# Patient Record
Sex: Male | Born: 2000 | Race: Black or African American | Hispanic: No | Marital: Single | Smoking: Current every day smoker
Health system: Southern US, Community
[De-identification: ages and names within clinical notes are randomized; demographics above are authoritative.]

## PROBLEM LIST (undated history)

## (undated) DIAGNOSIS — F4310 Post-traumatic stress disorder, unspecified: Secondary | ICD-10-CM

## (undated) DIAGNOSIS — F909 Attention-deficit hyperactivity disorder, unspecified type: Secondary | ICD-10-CM

## (undated) DIAGNOSIS — F419 Anxiety disorder, unspecified: Secondary | ICD-10-CM

## (undated) HISTORY — PX: WRIST SURGERY: SHX841

## (undated) HISTORY — PX: HAND SURGERY: SHX662

---
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
## 2018-04-15 ENCOUNTER — Other Ambulatory Visit: Payer: Self-pay

## 2018-04-15 ENCOUNTER — Emergency Department (HOSPITAL_COMMUNITY)
Admission: EM | Admit: 2018-04-15 | Discharge: 2018-04-15 | Discharge status: Against Medical Advice | Payer: Medicaid Other

## 2018-04-22 ENCOUNTER — Other Ambulatory Visit: Payer: Self-pay

## 2018-04-22 ENCOUNTER — Ambulatory Visit (HOSPITAL_COMMUNITY)
Admission: EM | Admit: 2018-04-22 | Discharge: 2018-04-22 | Disposition: A | Discharge status: Home / Self Care | Payer: Medicaid Other | Attending: Family Medicine | Admitting: Family Medicine

## 2018-04-22 ENCOUNTER — Encounter (HOSPITAL_COMMUNITY): Payer: Self-pay | Admitting: Emergency Medicine

## 2018-04-22 DIAGNOSIS — R112 Nausea with vomiting, unspecified: Secondary | ICD-10-CM

## 2018-04-22 DIAGNOSIS — R197 Diarrhea, unspecified: Secondary | ICD-10-CM

## 2018-04-22 MED ORDER — FLUTICASONE PROPIONATE 50 MCG/ACT NA SUSP
2.0000 | Freq: Every day | NASAL | 0 refills | Status: DC
Start: 2018-04-22 — End: 2018-07-22

## 2018-04-22 MED ORDER — ONDANSETRON 4 MG PO TBDP: 4.0000 mg | ORAL_TABLET | Freq: Three times a day (TID) | ORAL | 0 refills | Status: DC | PRN

## 2018-04-22 MED ORDER — DICYCLOMINE HCL 20 MG PO TABS: 20.0000 mg | ORAL_TABLET | Freq: Two times a day (BID) | ORAL | 0 refills | Status: DC

## 2018-04-22 NOTE — ED Provider Notes (Signed)
[facility]    CSN: [id number] Arrival date & time: 04/22/18  1918     History   Chief Complaint Chief Complaint  Patient presents with  . Abdominal Pain    HPI Benjamin Hughes is a 17 y.o. male.   17 year old male comes in for 2-day history of nausea, vomiting, diarrhea.  Has had 2-3 episodes per day of nonbilious nonbloody emesis.  3-5 episodes of watery diarrhea per day.  Denies hematochezia, melena.  Has periumbilical pain that is intermittent, cramping in nature, better with water intake and bowel movement.  He has some nasal congestion, sinus pressure, ear fullness.  Denies cough, rhinorrhea.  Denies fever, chills, night sweats.  Has been able to tolerate fluids without difficulty.  Denies sick contact.  Denies recent travel, antibiotic use.     History reviewed. No pertinent past medical history.  There are no active problems to display for this patient.   History reviewed. No pertinent surgical history.     Home Medications    Prior to Admission medications   Medication Sig Start Date End Date Taking? Authorizing Provider  amphetamine-dextroamphetamine (ADDERALL XR) 30 MG 24 hr capsule Take 30 mg by mouth daily.    [provider]  dicyclomine (BENTYL) 20 MG tablet Take 1 tablet (20 mg total) by mouth 2 (two) times daily. 04/22/18   Cathie Hoops, Amy V, PA-C  fluticasone (FLONASE) 50 MCG/ACT nasal spray Place 2 sprays into both nostrils daily. 04/22/18   Cathie Hoops, Amy V, PA-C  ondansetron (ZOFRAN ODT) 4 MG disintegrating tablet Take 1 tablet (4 mg total) by mouth every 8 (eight) hours as needed for nausea or vomiting. 04/22/18   Belinda Fisher, PA-C    Family History Family History  Problem Relation Age of Onset  . Healthy Mother   . Healthy Father     Social History Social History   Tobacco Use  . Smoking status: Current Every Day Smoker    Packs/day: 0.50    Types: Cigarettes  . Smokeless tobacco: Never Used  Substance Use Topics  . Alcohol use: Not  on file  . Drug use: Yes    Types: Marijuana     Allergies   Patient has no known allergies.   Review of Systems Review of Systems  Reason unable to perform ROS: See HPI as above.     Physical Exam Triage Vital Signs ED Triage Vitals  Enc Vitals Group     BP 04/22/18 1952 138/65     Pulse Rate 04/22/18 1952 62     Resp 04/22/18 1952 18     Temp 04/22/18 1952 98.5 F (36.9 C)     Temp Source 04/22/18 1952 Oral     SpO2 04/22/18 1952 100 %     Weight --      Height --      Head Circumference --      Peak Flow --      Pain Score 04/22/18 1953 4     Pain Loc --      Pain Edu? --      Excl. in GC? --    No data found.  Updated Vital Signs BP 138/65 (BP Location: Left Arm)   Pulse 62   Temp 98.5 F (36.9 C) (Oral)   Resp 18   SpO2 100%   Physical Exam  Constitutional: He is oriented to person, place, and time. He appears well-developed and well-nourished.  Non-toxic appearance. He does not appear ill. No distress.  HENT:  Head: Normocephalic and atraumatic.  Right Ear: Tympanic membrane, external ear and ear canal normal. Tympanic membrane is not erythematous and not bulging.  Left Ear: External ear and ear canal normal. Tympanic membrane is erythematous. Tympanic membrane is not bulging.  Nose: Nose normal. Right sinus exhibits no maxillary sinus tenderness and no frontal sinus tenderness. Left sinus exhibits no maxillary sinus tenderness and no frontal sinus tenderness.  Mouth/Throat: Uvula is midline, oropharynx is clear and moist and mucous membranes are normal.  Eyes: Pupils are equal, round, and reactive to light. Conjunctivae are normal.  Neck: Normal range of motion. Neck supple.  Cardiovascular: Normal rate, regular rhythm and normal heart sounds. Exam reveals no gallop and no friction rub.  No murmur heard. Pulmonary/Chest: Effort normal and breath sounds normal. He has no decreased breath sounds. He has no wheezes. He has no rhonchi. He has no rales.    Abdominal: Soft. Bowel sounds are normal. He exhibits no mass. There is no tenderness. There is no rebound and no guarding.  Lymphadenopathy:    He has no cervical adenopathy.  Neurological: He is alert and oriented to person, place, and time.  Skin: Skin is warm and dry.  Psychiatric: He has a normal mood and affect. His behavior is normal. Judgment normal.     UC Treatments / Results  Labs (all labs ordered are listed, but only abnormal results are displayed) Labs Reviewed - No data to display  EKG None  Radiology No results found.  Procedures Procedures (including critical care time)  Medications Ordered in UC Medications - No data to display  Initial Impression / Assessment and Plan / UC Course  I have reviewed the triage vital signs and the nursing notes.  Pertinent labs & imaging results that were available during my care of the patient were reviewed by me and considered in my medical decision making (see chart for details).    No alarming signs on exam.  Will provide symptomatic treatment.  Push fluids.  Return precautions given.  Patient expresses understanding and agrees to plan.  Final Clinical Impressions(s) / UC Diagnoses   Final diagnoses:  Nausea vomiting and diarrhea    ED Prescriptions    Medication Sig Dispense Auth. Provider   fluticasone (FLONASE) 50 MCG/ACT nasal spray Place 2 sprays into both nostrils daily. 1 g Yu, Amy V, PA-C   ondansetron (ZOFRAN ODT) 4 MG disintegrating tablet Take 1 tablet (4 mg total) by mouth every 8 (eight) hours as needed for nausea or vomiting. 20 tablet Yu, Amy V, PA-C   dicyclomine (BENTYL) 20 MG tablet Take 1 tablet (20 mg total) by mouth 2 (two) times daily. 20 tablet Threasa Alpha, [place] 04/22/18 2011

## 2018-04-22 NOTE — ED Triage Notes (Signed)
Abdominal pain and diarrhea, occasionally vomiting.  Symptoms for 2 days

## 2018-04-22 NOTE — Discharge Instructions (Signed)
Flonase for nasal congestion/sinus pressure. Zofran for nausea and vomiting as needed. Bentyl for abdominal cramping. Keep hydrated, you urine should be clear to pale yellow in color. Bland diet, advance as tolerated. Monitor for any worsening of symptoms, nausea or vomiting not controlled by medication, worsening abdominal pain, fever, follow-up for reevaluation.

## 2018-05-07 ENCOUNTER — Ambulatory Visit (HOSPITAL_COMMUNITY)
Admission: EM | Admit: 2018-05-07 | Discharge: 2018-05-07 | Disposition: A | Discharge status: Home / Self Care | Payer: Medicaid Other | Attending: Family Medicine | Admitting: Family Medicine

## 2018-05-07 ENCOUNTER — Encounter (HOSPITAL_COMMUNITY): Payer: Self-pay

## 2018-05-07 DIAGNOSIS — F17210 Nicotine dependence, cigarettes, uncomplicated: Secondary | ICD-10-CM | POA: Insufficient documentation

## 2018-05-07 DIAGNOSIS — J029 Acute pharyngitis, unspecified: Secondary | ICD-10-CM | POA: Insufficient documentation

## 2018-05-07 LAB — CBC WITH DIFFERENTIAL/PLATELET
Abs Immature Granulocytes: 0.02 10*3/uL (ref 0.00–0.07)
Basophils Absolute: 0 10*3/uL (ref 0.0–0.1)
Basophils Relative: 1 %
Eosinophils Absolute: 0.1 10*3/uL (ref 0.0–0.5)
Eosinophils Relative: 1 %
HCT: 45.6 % (ref 39.0–52.0)
Hemoglobin: 15.1 g/dL (ref 13.0–17.0)
Immature Granulocytes: 0 %
Lymphocytes Relative: 31 %
Lymphs Abs: 2.4 10*3/uL (ref 0.7–4.0)
MCH: 30 pg (ref 26.0–34.0)
MCHC: 33.1 g/dL (ref 30.0–36.0)
MCV: 90.7 fL (ref 80.0–100.0)
Monocytes Absolute: 0.8 10*3/uL (ref 0.1–1.0)
Monocytes Relative: 10 %
Neutro Abs: 4.4 10*3/uL (ref 1.7–7.7)
Neutrophils Relative %: 57 %
Platelets: 288 10*3/uL (ref 150–400)
RBC: 5.03 MIL/uL (ref 4.22–5.81)
RDW: 13.7 % (ref 11.5–15.5)
WBC: 7.7 10*3/uL (ref 4.0–10.5)
nRBC: 0 % (ref 0.0–0.2)

## 2018-05-07 LAB — POCT RAPID STREP A: STREPTOCOCCUS, GROUP A SCREEN (DIRECT): NEGATIVE

## 2018-05-07 MED ORDER — CHLORHEXIDINE GLUCONATE 0.12 % MT SOLN: 15.0000 mL | Freq: Two times a day (BID) | OROMUCOSAL | 0 refills | Status: DC

## 2018-05-07 NOTE — Discharge Instructions (Signed)
Your strep test is negative.  Your sore throat may represent a viral illness and we are checking to make sure you do not have an underlying serious illness.  You may want to contact the people at [facility] for job placement.

## 2018-05-07 NOTE — ED Provider Notes (Signed)
[facility]    CSN: [id number] Arrival date & time: 05/07/18  1601     History   Chief Complaint Chief Complaint  Patient presents with  . Sore Throat    HPI Benjamin Hughes is a 17 y.o. male.   Pt c/o sore throat and congestion x 2 days.     History reviewed. No pertinent past medical history.  There are no active problems to display for this patient.   History reviewed. No pertinent surgical history.     Home Medications    Prior to Admission medications   Medication Sig Start Date End Date Taking? Authorizing Provider  amphetamine-dextroamphetamine (ADDERALL XR) 30 MG 24 hr capsule Take 30 mg by mouth daily.    [provider]  dicyclomine (BENTYL) 20 MG tablet Take 1 tablet (20 mg total) by mouth 2 (two) times daily. 04/22/18   Cathie Hoops, Amy V, PA-C  fluticasone (FLONASE) 50 MCG/ACT nasal spray Place 2 sprays into both nostrils daily. 04/22/18   Cathie Hoops, Amy V, PA-C  ondansetron (ZOFRAN ODT) 4 MG disintegrating tablet Take 1 tablet (4 mg total) by mouth every 8 (eight) hours as needed for nausea or vomiting. 04/22/18   Belinda Fisher, PA-C    Family History Family History  Problem Relation Age of Onset  . Healthy Mother   . Healthy Father     Social History Social History   Tobacco Use  . Smoking status: Current Every Day Smoker    Packs/day: 0.50    Types: Cigarettes  . Smokeless tobacco: Never Used  Substance Use Topics  . Alcohol use: Not on file  . Drug use: Yes    Types: Marijuana     Allergies   Patient has no known allergies.   Review of Systems Review of Systems   Physical Exam Triage Vital Signs ED Triage Vitals [05/07/18 1721]  Enc Vitals Group     BP 123/73     Pulse Rate 64     Resp 16     Temp 97.9 F (36.6 C)     Temp Source Oral     SpO2 100 %     Weight 124 lb (56.2 kg)     Height      Head Circumference      Peak Flow      Pain Score 6     Pain Loc      Pain Edu?      Excl. in GC?    No data  found.  Updated Vital Signs BP 123/73 (BP Location: Right Arm)   Pulse 64   Temp 97.9 F (36.6 C) (Oral)   Resp 16   Wt 56.2 kg   SpO2 100%    Physical Exam  Constitutional: He is oriented to person, place, and time. He appears well-developed and well-nourished.  HENT:  Head: Normocephalic.  Right Ear: Hearing, tympanic membrane and ear canal normal.  Left Ear: Hearing, tympanic membrane and ear canal normal.  Mouth/Throat: Uvula is midline. Posterior oropharyngeal erythema present.  Eyes: Pupils are equal, round, and reactive to light. EOM are normal.  Neck: Normal range of motion. Neck supple.  Cardiovascular: Normal rate, normal heart sounds and intact distal pulses.  Pulmonary/Chest: Effort normal.  Neurological: He is alert and oriented to person, place, and time.  Skin: Skin is warm and dry.  Psychiatric: He has a normal mood and affect. His behavior is normal.  Nursing note and vitals reviewed.    UC Treatments /  Results  Labs (all labs ordered are listed, but only abnormal results are displayed) Labs Reviewed  CULTURE, GROUP A STREP [facility])  POCT RAPID STREP A    EKG None  Radiology No results found.  Procedures Procedures (including critical care time)  Medications Ordered in UC Medications - No data to display  Initial Impression / Assessment and Plan / UC Course  I have reviewed the triage vital signs and the nursing notes.  Pertinent labs & imaging results that were available during my care of the patient were reviewed by me and considered in my medical decision making (see chart for details).    Final Clinical Impressions(s) / UC Diagnoses   Final diagnoses:  None   Discharge Instructions   None    ED Prescriptions    None     Controlled Substance Prescriptions [place] Controlled Substance Registry consulted? Not Applicable   Elvina Sidle, MD 05/07/18 1758

## 2018-05-07 NOTE — ED Triage Notes (Signed)
Pt c/o sore throat and congestion x2 days

## 2018-05-08 LAB — EPSTEIN-BARR VIRUS VCA, IGM: EBV VCA IgM: <36 U/mL (ref 0.0–35.9)

## 2018-05-08 LAB — HIV ANTIBODY (ROUTINE TESTING W REFLEX): HIV Screen 4th Generation wRfx: NONREACTIVE

## 2018-05-10 LAB — CULTURE, GROUP A STREP (THRC)

## 2018-07-22 ENCOUNTER — Encounter (HOSPITAL_COMMUNITY): Payer: Self-pay | Admitting: Emergency Medicine

## 2018-07-22 ENCOUNTER — Ambulatory Visit (INDEPENDENT_AMBULATORY_CARE_PROVIDER_SITE_OTHER): Payer: Medicaid Other

## 2018-07-22 ENCOUNTER — Other Ambulatory Visit: Payer: Self-pay

## 2018-07-22 ENCOUNTER — Ambulatory Visit (HOSPITAL_COMMUNITY)
Admission: EM | Admit: 2018-07-22 | Discharge: 2018-07-22 | Disposition: A | Discharge status: Home / Self Care | Payer: Medicaid Other | Attending: Emergency Medicine | Admitting: Emergency Medicine

## 2018-07-22 DIAGNOSIS — R0602 Shortness of breath: Secondary | ICD-10-CM | POA: Diagnosis not present

## 2018-07-22 DIAGNOSIS — S2231XA Fracture of one rib, right side, initial encounter for closed fracture: Secondary | ICD-10-CM

## 2018-07-22 DIAGNOSIS — S60211A Contusion of right wrist, initial encounter: Secondary | ICD-10-CM

## 2018-07-22 DIAGNOSIS — M25531 Pain in right wrist: Secondary | ICD-10-CM

## 2018-07-22 MED ORDER — IBUPROFEN 600 MG PO TABS: 600.0000 mg | ORAL_TABLET | Freq: Four times a day (QID) | ORAL | 0 refills | Status: DC | PRN

## 2018-07-22 MED ORDER — TIZANIDINE HCL 4 MG PO TABS: 4.0000 mg | ORAL_TABLET | Freq: Three times a day (TID) | ORAL | 0 refills | Status: DC | PRN

## 2018-07-22 MED ORDER — HYDROCODONE-ACETAMINOPHEN 5-325 MG PO TABS: 1.0000 | ORAL_TABLET | Freq: Four times a day (QID) | ORAL | 0 refills | Status: DC | PRN
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
## 2018-07-22 NOTE — Discharge Instructions (Addendum)
Your x-ray was suspicious for a nondisplaced ninth rib fracture.  Otherwise your x-ray was normal.  Use the incentive spirometer 2-4 times an hour to help prevent a pneumonia as we discussed.  Take 600 mg ibuprofen combined with a Tylenol containing product 3 or 4 times a day as needed for pain.  Take the ibuprofen with 1 g of Tylenol for mild to moderate pain, ibuprofen with 1-2 Norco for severe pain only.  Do not take the Tylenol and Norco as they both have Tylenol in them and too much Tylenol can hurt your liver.  This may take up to 4 to 6 weeks to completely heal.  Wear the wrist splint as needed for comfort.  This should be better in about 10 days.
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
## 2018-08-01 ENCOUNTER — Encounter (HOSPITAL_COMMUNITY): Payer: Self-pay

## 2018-08-01 ENCOUNTER — Other Ambulatory Visit: Payer: Self-pay

## 2018-08-01 ENCOUNTER — Ambulatory Visit (HOSPITAL_COMMUNITY)
Admission: EM | Admit: 2018-08-01 | Discharge: 2018-08-01 | Disposition: A | Discharge status: Home / Self Care | Payer: Medicaid Other | Attending: Internal Medicine | Admitting: Internal Medicine

## 2018-08-01 DIAGNOSIS — A6002 Herpesviral infection of other male genital organs: Secondary | ICD-10-CM | POA: Insufficient documentation

## 2018-08-01 DIAGNOSIS — Z711 Person with feared health complaint in whom no diagnosis is made: Secondary | ICD-10-CM | POA: Diagnosis present

## 2018-08-01 DIAGNOSIS — Z113 Encounter for screening for infections with a predominantly sexual mode of transmission: Secondary | ICD-10-CM

## 2018-08-01 DIAGNOSIS — Z202 Contact with and (suspected) exposure to infections with a predominantly sexual mode of transmission: Secondary | ICD-10-CM

## 2018-08-01 DIAGNOSIS — Z760 Encounter for issue of repeat prescription: Secondary | ICD-10-CM | POA: Diagnosis not present

## 2018-08-01 DIAGNOSIS — Z8619 Personal history of other infectious and parasitic diseases: Secondary | ICD-10-CM

## 2018-08-01 LAB — POCT URINALYSIS DIP (DEVICE)
BILIRUBIN URINE: NEGATIVE
GLUCOSE, UA: NEGATIVE mg/dL
KETONES UR: NEGATIVE mg/dL
Leukocytes, UA: NEGATIVE
Nitrite: NEGATIVE
PH: 6.5 (ref 5.0–8.0)
Protein, ur: NEGATIVE mg/dL
SPECIFIC GRAVITY, URINE: 1.025 (ref 1.005–1.030)
Urobilinogen, UA: 0.2 mg/dL (ref 0.0–1.0)

## 2018-08-01 MED ORDER — METRONIDAZOLE 500 MG PO TABS
ORAL_TABLET | ORAL | Status: AC
  Filled 2018-08-01: qty 4

## 2018-08-01 MED ORDER — METRONIDAZOLE 500 MG PO TABS
2000.0000 mg | ORAL_TABLET | Freq: Once | ORAL | Status: AC
  Administered 2018-08-01: 2000 mg via ORAL

## 2018-08-01 MED ORDER — VALACYCLOVIR HCL 1 G PO TABS: 1000.0000 mg | ORAL_TABLET | Freq: Every day | ORAL | 0 refills | Status: AC

## 2018-08-01 NOTE — Discharge Instructions (Signed)
Declines treatment for gonorrhea and chlamydia, would like to wait on results Metronidazole given in office for potential trich Urine cytology obtained  HIV/ syphilis testing today Valtrex refilled for current herpes outbreak.  Take as directed and to completion We will follow up with you regarding the results of your test If tests are positive, please abstain from sexual activity for at least 7 days and notify partners Follow up with PCP or community health if symptoms persists Return here or go to ER if you have any new or worsening symptoms such as fever, chills, nausea, vomiting, abdominal or pelvic pain, urethral discharge, dysuria, penile rashes or lesions, testicular swelling or pain.

## 2018-08-01 NOTE — ED Provider Notes (Addendum)
[facility]   [id number] 08/01/18 Arrival Time: 1558   WU:[id number]:CONCERN FOR STD  SUBJECTIVE:  Benjamin Hughes is a 18 y.o. male who presents requesting STI screening.  Currently asymptomatic.  Partner tested positive for trich.  Last unprotected sexual encounter 2 weeks ago.  Sexually active with 3 male partners in the last 6 months.  Reports hx of STD in the past and diagnosed with herpes. Requests prescription refill for valtrex for new outbreak.  Denies fever, chills, nausea, vomiting, abdominal or pelvic pain, urethral discharge, dysuria, penile rashes or lesions, testicular swelling or pain.       No LMP for male patient.  ROS: As per HPI.  History reviewed. No pertinent past medical history. History reviewed. No pertinent surgical history. No Known Allergies No current facility-administered medications on file prior to encounter.    Current Outpatient Medications on File Prior to Encounter  Medication Sig Dispense Refill  . tiZANidine (ZANAFLEX) 4 MG tablet Take 1 tablet (4 mg total) by mouth every 8 (eight) hours as needed for muscle spasms. 30 tablet 0  . HYDROcodone-acetaminophen (NORCO/VICODIN) 5-325 MG tablet Take 1-2 tablets by mouth every 6 (six) hours as needed for moderate pain or severe pain. 12 tablet 0  . ibuprofen (ADVIL,MOTRIN) 600 MG tablet Take 1 tablet (600 mg total) by mouth every 6 (six) hours as needed. 30 tablet 0   Social History   Socioeconomic History  . Marital status: Single    Spouse name: Not on file  . Number of children: Not on file  . Years of education: Not on file  . Highest education level: Not on file  Occupational History  . Not on file  Social Needs  . Financial resource strain: Not on file  . Food insecurity:    Worry: Not on file    Inability: Not on file  . Transportation needs:    Medical: Not on file    Non-medical: Not on file  Tobacco Use  . Smoking status: Current Every Day Smoker    Packs/day: 0.25    Types:  Cigarettes  . Smokeless tobacco: Never Used  Substance and Sexual Activity  . Alcohol use: Not on file  . Drug use: Yes    Types: Marijuana  . Sexual activity: Not on file  Lifestyle  . Physical activity:    Days per week: Not on file    Minutes per session: Not on file  . Stress: Not on file  Relationships  . Social connections:    Talks on phone: Not on file    Gets together: Not on file    Attends religious service: Not on file    Active member of club or organization: Not on file    Attends meetings of clubs or organizations: Not on file    Relationship status: Not on file  . Intimate partner violence:    Fear of current or ex partner: Not on file    Emotionally abused: Not on file    Physically abused: Not on file    Forced sexual activity: Not on file  Other Topics Concern  . Not on file  Social History Narrative  . Not on file   Family History  Problem Relation Age of Onset  . Healthy Mother   . Healthy Father     OBJECTIVE:  Vitals:   08/01/18 1736 08/01/18 1739  BP: 123/64   Pulse: 65   Resp: 16   Temp: 99.2 F (37.3 C)  TempSrc: Oral   SpO2: 100% 100%     General appearance: alert, NAD, appears stated age Head: NCAT Throat: lips, mucosa, and tongue normal; teeth and gums normal Lungs: CTA bilaterally without adventitious breath sounds Heart: regular rate and rhythm.  Radial pulses 2+ symmetrical bilaterally Back: no CVA tenderness Abdomen: soft, non-tender; bowel sounds normal; no guarding GU: deferred; urine cytology Skin: warm and dry Psychological:  Alert and cooperative. Normal mood and affect.  LABS:  Results for orders placed or performed during the hospital encounter of 08/01/18  POCT urinalysis dip (device)  Result Value Ref Range   Glucose, UA NEGATIVE NEGATIVE mg/dL   Bilirubin Urine NEGATIVE NEGATIVE   Ketones, ur NEGATIVE NEGATIVE mg/dL   Specific Gravity, Urine 1.025 1.005 - 1.030   Hgb urine dipstick TRACE (A) NEGATIVE    pH 6.5 5.0 - 8.0   Protein, ur NEGATIVE NEGATIVE mg/dL   Urobilinogen, UA 0.2 0.0 - 1.0 mg/dL   Nitrite NEGATIVE NEGATIVE   Leukocytes, UA NEGATIVE NEGATIVE    Labs Reviewed  POCT URINALYSIS DIP (DEVICE) - Abnormal; Notable for the following components:      Result Value   Hgb urine dipstick TRACE (*)    All other components within normal limits  RPR  HIV ANTIBODY (ROUTINE TESTING W REFLEX)  URINE CYTOLOGY ANCILLARY ONLY    ASSESSMENT & PLAN:  1. Concern about STD in male without diagnosis   2. STD exposure   3. Herpes genitalis in men     Meds ordered this encounter  Medications  . metroNIDAZOLE (FLAGYL) tablet 2,000 mg  . valACYclovir (VALTREX) 1000 MG tablet    Sig: Take 1 tablet (1,000 mg total) by mouth daily for 5 days.    Dispense:  5 tablet    Refill:  0    Order Specific Question:   Supervising Provider    Answer:   Eustace MooreELSON, YVONNE SUE [[id number]]    Pending: Labs Reviewed  POCT URINALYSIS DIP (DEVICE) - Abnormal; Notable for the following components:      Result Value   Hgb urine dipstick TRACE (*)    All other components within normal limits  RPR  HIV ANTIBODY (ROUTINE TESTING W REFLEX)  URINE CYTOLOGY ANCILLARY ONLY    Declines treatment for gonorrhea and chlamydia, would like to wait on results Metronidazole given in office for potential trich Urine cytology obtained  HIV/ syphilis testing today Valtrex refilled for current herpes outbreak.  Take as directed and to completion We will follow up with you regarding the results of your test If tests are positive, please abstain from sexual activity for at least 7 days and notify partners Follow up with PCP or community health if symptoms persists Return here or go to ER if you have any new or worsening symptoms such as fever, chills, nausea, vomiting, abdominal or pelvic pain, urethral discharge, dysuria, penile rashes or lesions, testicular swelling or pain.      Reviewed expectations re: course of  current medical issues. Questions answered. Outlined signs and symptoms indicating need for more acute intervention. Patient verbalized understanding. After Visit Summary given.       Rennis HardingWurst, Shirley Bolle, PA-C 08/01/18 1838    Rennis HardingWurst, Andoni Busch, PA-C 08/01/18 1839

## 2018-08-01 NOTE — ED Triage Notes (Addendum)
[facility] due to exposure from partner, partner tested positive for Trichomoniasis yesterday 07/31/2018. Pt also complains of burning sensation in penis area.

## 2018-08-02 LAB — HIV ANTIBODY (ROUTINE TESTING W REFLEX): HIV Screen 4th Generation wRfx: NONREACTIVE

## 2018-08-02 LAB — RPR: RPR: NONREACTIVE

## 2018-08-04 ENCOUNTER — Telehealth (HOSPITAL_COMMUNITY): Payer: Self-pay | Admitting: Emergency Medicine

## 2018-08-04 LAB — URINE CYTOLOGY ANCILLARY ONLY
Chlamydia: NEGATIVE
Neisseria Gonorrhea: NEGATIVE
Trichomonas: POSITIVE — AB

## 2018-08-04 NOTE — Telephone Encounter (Signed)
Trichomonas is positive. Rx metronidazole was given at the urgent care visit. Pt needs education to please refrain from sexual intercourse for 7 days to give the medicine time to work. Sexual partners need to be notified and tested/treated. Condoms may reduce risk of reinfection. Recheck for further evaluation if symptoms are not improving.   Attempted to reach patient. No answer at this time. Voicemail left.    

## 2018-08-06 ENCOUNTER — Ambulatory Visit (HOSPITAL_COMMUNITY)
Admission: EM | Admit: 2018-08-06 | Discharge: 2018-08-06 | Disposition: A | Discharge status: Home / Self Care | Payer: Medicaid Other

## 2018-08-06 NOTE — ED Notes (Signed)
Informed patient of his test results from his visit on 08/01/2018 an dlet him know his medication was at the pharmacy.

## 2018-08-07 ENCOUNTER — Telehealth (HOSPITAL_COMMUNITY): Payer: Self-pay | Admitting: Emergency Medicine

## 2018-08-07 NOTE — Telephone Encounter (Signed)
Patient informed by staff of results yesterday.

## 2018-09-16 ENCOUNTER — Ambulatory Visit (INDEPENDENT_AMBULATORY_CARE_PROVIDER_SITE_OTHER): Payer: Medicaid Other

## 2018-09-16 ENCOUNTER — Ambulatory Visit (HOSPITAL_COMMUNITY)
Admission: EM | Admit: 2018-09-16 | Discharge: 2018-09-16 | Disposition: A | Discharge status: Home / Self Care | Payer: Medicaid Other | Attending: Family Medicine | Admitting: Family Medicine

## 2018-09-16 ENCOUNTER — Encounter (HOSPITAL_COMMUNITY): Payer: Self-pay | Admitting: Emergency Medicine

## 2018-09-16 DIAGNOSIS — S0993XA Unspecified injury of face, initial encounter: Secondary | ICD-10-CM | POA: Diagnosis not present

## 2018-09-16 DIAGNOSIS — S022XXA Fracture of nasal bones, initial encounter for closed fracture: Secondary | ICD-10-CM | POA: Diagnosis not present

## 2018-09-16 DIAGNOSIS — S63502A Unspecified sprain of left wrist, initial encounter: Secondary | ICD-10-CM

## 2018-09-16 MED ORDER — IBUPROFEN 800 MG PO TABS: 800.0000 mg | ORAL_TABLET | Freq: Three times a day (TID) | ORAL | 0 refills | Status: DC
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
## 2018-09-16 NOTE — Discharge Instructions (Signed)
Nasal fracture should heal on its own in 4 to 6 weeks If having persistent issues please follow-up with [facility]NT Use anti-inflammatories for pain/swelling. You may take up to 800 mg Ibuprofen every 8 hours with food. You may supplement Ibuprofen with Tylenol [phone] mg every 8 hours.   Ice  Follow-up if having persistent headaches, change in vision, worsening pain, difficulty moving jaw.

## 2018-09-19 ENCOUNTER — Ambulatory Visit (HOSPITAL_COMMUNITY)
Admission: EM | Admit: 2018-09-19 | Discharge: 2018-09-19 | Disposition: A | Discharge status: Home / Self Care | Payer: Medicaid Other | Attending: Family Medicine | Admitting: Family Medicine

## 2018-09-19 ENCOUNTER — Other Ambulatory Visit: Payer: Self-pay

## 2018-09-19 ENCOUNTER — Encounter (HOSPITAL_COMMUNITY): Payer: Self-pay | Admitting: Family Medicine

## 2018-09-19 DIAGNOSIS — J320 Chronic maxillary sinusitis: Secondary | ICD-10-CM | POA: Diagnosis not present

## 2018-09-19 DIAGNOSIS — R05 Cough: Secondary | ICD-10-CM | POA: Diagnosis not present

## 2018-09-19 DIAGNOSIS — S022XXD Fracture of nasal bones, subsequent encounter for fracture with routine healing: Secondary | ICD-10-CM

## 2018-09-19 MED ORDER — HYDROCODONE-ACETAMINOPHEN 5-325 MG PO TABS: 1.0000 | ORAL_TABLET | Freq: Four times a day (QID) | ORAL | 0 refills | Status: DC | PRN

## 2018-09-19 MED ORDER — AMOXICILLIN 875 MG PO TABS: 875.0000 mg | ORAL_TABLET | Freq: Two times a day (BID) | ORAL | 0 refills | Status: DC

## 2018-09-19 NOTE — Discharge Instructions (Addendum)
You will benefit from some oxymetazoline (Afrin) which is over-the-counter nasal spray.  Spray in 1 nostril or the other every night before going to bed so that you can breathe better.
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
## 2018-09-19 NOTE — ED Triage Notes (Signed)
Per pt he has been having body aches, cough, congestion for about 1 day now. With chills and weakness. No fevers. N/V BOTH TODAY

## 2018-10-06 ENCOUNTER — Other Ambulatory Visit: Payer: Self-pay

## 2018-10-06 ENCOUNTER — Ambulatory Visit (HOSPITAL_COMMUNITY)
Admission: EM | Admit: 2018-10-06 | Discharge: 2018-10-06 | Disposition: A | Discharge status: Home / Self Care | Payer: Medicaid Other | Attending: Family Medicine | Admitting: Family Medicine

## 2018-10-06 ENCOUNTER — Encounter (HOSPITAL_COMMUNITY): Payer: Self-pay | Admitting: Emergency Medicine

## 2018-10-06 DIAGNOSIS — R300 Dysuria: Secondary | ICD-10-CM | POA: Diagnosis not present

## 2018-10-06 DIAGNOSIS — B0089 Other herpesviral infection: Secondary | ICD-10-CM

## 2018-10-06 DIAGNOSIS — M25511 Pain in right shoulder: Secondary | ICD-10-CM | POA: Diagnosis not present

## 2018-10-06 DIAGNOSIS — Z760 Encounter for issue of repeat prescription: Secondary | ICD-10-CM

## 2018-10-06 DIAGNOSIS — Z113 Encounter for screening for infections with a predominantly sexual mode of transmission: Secondary | ICD-10-CM | POA: Diagnosis not present

## 2018-10-06 DIAGNOSIS — Z202 Contact with and (suspected) exposure to infections with a predominantly sexual mode of transmission: Secondary | ICD-10-CM | POA: Diagnosis present

## 2018-10-06 HISTORY — DX: Attention-deficit hyperactivity disorder, unspecified type: F90.9

## 2018-10-06 HISTORY — DX: Post-traumatic stress disorder, unspecified: F43.10

## 2018-10-06 HISTORY — DX: Anxiety disorder, unspecified: F41.9

## 2018-10-06 LAB — POCT URINALYSIS DIP (DEVICE)
Glucose, UA: 100 mg/dL — AB
Hgb urine dipstick: NEGATIVE
Ketones, ur: 15 mg/dL — AB
Leukocytes,Ua: NEGATIVE
Nitrite: NEGATIVE
Protein, ur: NEGATIVE mg/dL
Specific Gravity, Urine: >=1.03 (ref 1.005–1.030)
Urobilinogen, UA: 1 mg/dL (ref 0.0–1.0)
pH: 6 (ref 5.0–8.0)

## 2018-10-06 MED ORDER — VALACYCLOVIR HCL 1 G PO TABS: 1000.0000 mg | ORAL_TABLET | Freq: Every day | ORAL | 1 refills | Status: DC

## 2018-10-06 MED ORDER — CYCLOBENZAPRINE HCL 10 MG PO TABS: 10.0000 mg | ORAL_TABLET | Freq: Two times a day (BID) | ORAL | 0 refills | Status: DC | PRN

## 2018-10-06 MED ORDER — IBUPROFEN 800 MG PO TABS: 800.0000 mg | ORAL_TABLET | Freq: Three times a day (TID) | ORAL | 0 refills | Status: DC

## 2018-10-06 NOTE — ED Provider Notes (Signed)
[facility]    CSN: [id number] Arrival date & time: 10/06/18  1158     History   Chief Complaint Chief Complaint  Patient presents with  . Urinary Tract Infection  . Medication Refill    HPI Benjamin Hughes is a 18 y.o. male.   HPI  Patient is here for 2 problems.  First he has right shoulder pain.  He states he discussed with his primary care doctor, and he had an appointment to go see the doctor.  The appointment got canceled because of changes in the schedule because of coronavirus quarantine. He has been taking ibuprofen.  He states that this helps somewhat.  He is having trouble sleeping at night because of the shoulder pain. He also states he thinks he has "a urinary tract infection".  He has a little bit of frequency.  He states he would like testing for sexually transmitted diseases.  He states he only has 1 partner, and that she is asymptomatic. Patient also would like a refill of his Valtrex.  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 ADHD   . Anxiety   . PTSD (post-traumatic stress disorder)     There are no active problems to display for this patient.   Past Surgical History:  Procedure Laterality Date  . WRIST SURGERY         Home Medications    Prior to Admission medications   Medication Sig Start Date End Date Taking? Authorizing Provider  amphetamine-dextroamphetamine (ADDERALL) 30 MG tablet Take 30 mg by mouth daily.   Yes [provider]  sertraline (ZOLOFT) 25 MG tablet Take 25 mg by mouth daily.   Yes [provider]  cyclobenzaprine (FLEXERIL) 10 MG tablet Take 1 tablet (10 mg total) by mouth 2 (two) times daily as needed for muscle spasms. 10/06/18   Eustace MooreNelson, Larysa Pall Sue, MD  ibuprofen (ADVIL,MOTRIN) 800 MG tablet Take 1 tablet (800 mg total) by mouth 3 (three) times daily. 10/06/18   Eustace MooreNelson, Celisa Schoenberg Sue, MD  valACYclovir (VALTREX) 1000 MG tablet Take 1 tablet (1,000 mg  total) by mouth daily. 10/06/18   Eustace MooreNelson, Salvador Bigbee Sue, MD    Family History Family History  Problem Relation Age of Onset  . Healthy Mother   . Healthy Father     Social History Social History   Tobacco Use  . Smoking status: Current Every Day Smoker    Packs/day: 0.25    Types: Cigarettes  . Smokeless tobacco: Never Used  Substance Use Topics  . Alcohol use: Yes  . Drug use: Yes    Types: Marijuana     Allergies   Patient has no known allergies.   Review of Systems Review of Systems  Constitutional: Negative for chills and fever.  HENT: Negative for ear pain and sore throat.   Eyes: Negative for pain and visual disturbance.  Respiratory: Negative for cough and shortness of breath.   Cardiovascular: Negative for chest pain and palpitations.  Gastrointestinal: Negative for abdominal pain and vomiting.  Genitourinary: Positive for frequency. Negative for dysuria and hematuria.  Musculoskeletal: Positive for arthralgias. Negative for back pain.  Skin: Negative for color change and rash.  Neurological: Negative for seizures and syncope.  All other systems reviewed and are negative.    Physical Exam Triage Vital Signs ED Triage Vitals  Enc Vitals Group     BP 10/06/18 1222 122/75     Pulse Rate 10/06/18 1222  73     Resp 10/06/18 1222 18     Temp 10/06/18 1222 98 F (36.7 C)     Temp Source 10/06/18 1222 Oral     SpO2 10/06/18 1222 100 %     Weight --      Height --      Head Circumference --      Peak Flow --      Pain Score 10/06/18 1218 5     Pain Loc --      Pain Edu? --      Excl. in GC? --    No data found.  Updated Vital Signs BP 122/75 (BP Location: Right Arm)   Pulse 73   Temp 98 F (36.7 C) (Oral)   Resp 18   SpO2 100%      Physical Exam Constitutional:      General: He is not in acute distress.    Appearance: He is well-developed.  HENT:     Head: Normocephalic and atraumatic.     Right Ear: Tympanic membrane and ear canal normal.      Left Ear: Tympanic membrane and ear canal normal.     Nose: Nose normal.     Mouth/Throat:     Mouth: Mucous membranes are moist.  Eyes:     Conjunctiva/sclera: Conjunctivae normal.     Pupils: Pupils are equal, round, and reactive to light.  Neck:     Musculoskeletal: Normal range of motion.  Cardiovascular:     Rate and Rhythm: Normal rate.  Pulmonary:     Effort: Pulmonary effort is normal. No respiratory distress.  Abdominal:     General: There is no distension.     Palpations: Abdomen is soft.  Musculoskeletal: Normal range of motion.     Comments: Right shoulder has some tenderness anteriorly.  Pain with abduction over 90 degrees.  Mild pain with rotation.  Strength is good.  Skin:    General: Skin is warm and dry.  Neurological:     Mental Status: He is alert.      UC Treatments / Results  Labs (all labs ordered are listed, but only abnormal results are displayed) Labs Reviewed  POCT URINALYSIS DIP (DEVICE) - Abnormal; Notable for the following components:      Result Value   Glucose, UA 100 (*)    Bilirubin Urine SMALL (*)    Ketones, ur 15 (*)    All other components within normal limits  URINE CYTOLOGY ANCILLARY ONLY    EKG None  Radiology No results found.  Procedures Procedures (including critical care time)  Medications Ordered in UC Medications - No data to display  Initial Impression / Assessment and Plan / UC Course  I have reviewed the triage vital signs and the nursing notes.  Pertinent labs & imaging results that were available during my care of the patient were reviewed by me and considered in my medical decision making (see chart for details).     I am not going to treat him with antibiotics with a normal urinalysis, and no known exposure to illness.  Final Clinical Impressions(s) / UC Diagnoses   Final diagnoses:  Possible exposure to STD  Dysuria  Pain in joint of right shoulder     Discharge Instructions     Take  ibuprofen 3 times a day with food This is for pain and inflammation around the shoulder joint Take Flexeril at bedtime Follow-up with your primary care doctor  Valtrex is refilled  for as needed use  Your urine test today looks normal. We did lab testing during this visit.  If there are any abnormal findings that require change in medicine or indicate a positive result, you will be notified.  If all of your tests are normal, you will not be called.  We are sending the urine test for additional testing for infection.    ED Prescriptions    Medication Sig Dispense Auth. Provider   valACYclovir (VALTREX) 1000 MG tablet Take 1 tablet (1,000 mg total) by mouth daily. 5 tablet Eustace Moore, MD   cyclobenzaprine (FLEXERIL) 10 MG tablet Take 1 tablet (10 mg total) by mouth 2 (two) times daily as needed for muscle spasms. 20 tablet Eustace Moore, MD   ibuprofen (ADVIL,MOTRIN) 800 MG tablet Take 1 tablet (800 mg total) by mouth 3 (three) times daily. 21 tablet Eustace Moore, MD     Controlled Substance Prescriptions [place] Controlled Substance Registry consulted? Not Applicable   Eustace Moore, MD 10/06/18 1536

## 2018-10-06 NOTE — ED Triage Notes (Signed)
Requesting refill of valtrex medication.   Patient concerned for UTI.  After urination, continues to feel the urge to urinate, burning with urination, orange urine, pressure in lower abdomen.  Denies penile discharge.  Wants to be tested for everything.

## 2018-10-06 NOTE — ED Notes (Signed)
Requested dirty and clean urine from patient

## 2018-10-06 NOTE — Discharge Instructions (Addendum)
Take ibuprofen 3 times a day with food This is for pain and inflammation around the shoulder joint Take Flexeril at bedtime Follow-up with your primary care doctor  Valtrex is refilled for as needed use  Your urine test today looks normal. We did lab testing during this visit.  If there are any abnormal findings that require change in medicine or indicate a positive result, you will be notified.  If all of your tests are normal, you will not be called.  We are sending the urine test for additional testing for infection.

## 2018-10-07 LAB — URINE CYTOLOGY ANCILLARY ONLY
CHLAMYDIA, DNA PROBE: NEGATIVE
Neisseria Gonorrhea: NEGATIVE
Trichomonas: NEGATIVE

## 2019-01-15 ENCOUNTER — Encounter (HOSPITAL_COMMUNITY): Payer: Self-pay

## 2019-01-15 ENCOUNTER — Other Ambulatory Visit: Payer: Self-pay

## 2019-01-15 ENCOUNTER — Ambulatory Visit (HOSPITAL_COMMUNITY)
Admission: EM | Admit: 2019-01-15 | Discharge: 2019-01-15 | Disposition: A | Discharge status: Home / Self Care | Payer: Medicaid Other | Attending: Family Medicine | Admitting: Family Medicine

## 2019-01-15 DIAGNOSIS — M62838 Other muscle spasm: Secondary | ICD-10-CM | POA: Insufficient documentation

## 2019-01-15 DIAGNOSIS — R300 Dysuria: Secondary | ICD-10-CM | POA: Insufficient documentation

## 2019-01-15 DIAGNOSIS — R1032 Left lower quadrant pain: Secondary | ICD-10-CM | POA: Diagnosis present

## 2019-01-15 LAB — POCT URINALYSIS DIP (DEVICE)
Bilirubin Urine: NEGATIVE
Glucose, UA: 100 mg/dL — AB
Hgb urine dipstick: NEGATIVE
Ketones, ur: NEGATIVE mg/dL
Leukocytes,Ua: NEGATIVE
Nitrite: NEGATIVE
Protein, ur: NEGATIVE mg/dL
Specific Gravity, Urine: 1.015 (ref 1.005–1.030)
Urobilinogen, UA: 0.2 mg/dL (ref 0.0–1.0)
pH: 7 (ref 5.0–8.0)

## 2019-01-15 MED ORDER — CYCLOBENZAPRINE HCL 10 MG PO TABS: 10.0000 mg | ORAL_TABLET | Freq: Two times a day (BID) | ORAL | 0 refills | Status: DC | PRN

## 2019-01-15 MED ORDER — LIDOCAINE HCL 2 % IJ SOLN
INTRAMUSCULAR | Status: AC
  Filled 2019-01-15: qty 20

## 2019-01-15 MED ORDER — CEFTRIAXONE SODIUM 250 MG IJ SOLR
INTRAMUSCULAR | Status: AC
  Filled 2019-01-15: qty 250

## 2019-01-15 MED ORDER — CEFTRIAXONE SODIUM 250 MG IJ SOLR
250.0000 mg | Freq: Once | INTRAMUSCULAR | Status: AC
  Administered 2019-01-15: 250 mg via INTRAMUSCULAR

## 2019-01-15 MED ORDER — LIDOCAINE-EPINEPHRINE (PF) 2 %-1:200000 IJ SOLN
INTRAMUSCULAR | Status: AC
  Filled 2019-01-15: qty 20

## 2019-01-15 MED ORDER — DOXYCYCLINE HYCLATE 100 MG PO CAPS: 100.0000 mg | ORAL_CAPSULE | Freq: Two times a day (BID) | ORAL | 0 refills | Status: AC

## 2019-01-15 NOTE — Discharge Instructions (Signed)
We have treated you today for gonorrhea, with rocephin. Begin doxycycline twice daily for 1 week to treat chlamydia/urethritis. Please refrain from sexual activity for 7 days while medicine is clearing infection.  We are testing you for Gonorrhea, Chlamydia and Trichomonas. We will call you if anything is positive and let you know if you require any further treatment. Please inform partner of any positive results.  I refilled flexeril to use after work for muscle spasms  Please return if symptoms not improving with treatment, development of fever, nausea, vomiting, abdominal pain, scrotal pain.

## 2019-01-15 NOTE — ED Triage Notes (Signed)
Pt presents with urinary tract symptoms; urinary urgency, burning and irritation X 3 days  Pt would also like STD Testing.

## 2019-01-16 NOTE — ED Provider Notes (Addendum)
[facility]    CSN: [id number] Arrival date & time: 01/15/19  1000      History   Chief Complaint Chief Complaint  Patient presents with  . Appointment  . (9:50) UTI ; STD Testing    HPI Benjamin Hughes is a 18 y.o. male no contributing past medical history presenting today for evaluation of possible UTI/STD screening.  Patient states that over the past week he has developed some slight dysuria but feels pressure with urination as well as some left lower abdominal discomfort.  He is concerned about STDs, but at his last visit with similar symptoms he tested negative for gonorrhea chlamydia and trichomonas.  Denies history of UTIs.  Denies any penile discharge.  Denies known exposures.  Denies nausea or vomiting.  Denies changes in bowel movements.  Bowel movements have been normal.  Has been eating and drinking like normal.  Requesting refill of flexeril as this helped his muscle spasms after work.   HPI  Past Medical History:  Diagnosis Date  . ADHD   . Anxiety   . PTSD (post-traumatic stress disorder)     There are no active problems to display for this patient.   Past Surgical History:  Procedure Laterality Date  . WRIST SURGERY         Home Medications    Prior to Admission medications   Medication Sig Start Date End Date Taking? Authorizing Provider  amphetamine-dextroamphetamine (ADDERALL) 30 MG tablet Take 30 mg by mouth daily.    [provider]  cyclobenzaprine (FLEXERIL) 10 MG tablet Take 1 tablet (10 mg total) by mouth 2 (two) times daily as needed for muscle spasms. 01/15/19   Khushboo Chuck C, PA-C  doxycycline (VIBRAMYCIN) 100 MG capsule Take 1 capsule (100 mg total) by mouth 2 (two) times daily for 7 days. 01/15/19 01/22/19  Saquoia Sianez C, PA-C  ibuprofen (ADVIL,MOTRIN) 800 MG tablet Take 1 tablet (800 mg total) by mouth 3 (three) times daily. 10/06/18   Eustace MooreNelson, Yvonne Sue, MD  sertraline (ZOLOFT) 25 MG tablet Take 25 mg by mouth  daily.    [provider]  valACYclovir (VALTREX) 1000 MG tablet Take 1 tablet (1,000 mg total) by mouth daily. 10/06/18   Eustace MooreNelson, Yvonne Sue, MD    Family History Family History  Problem Relation Age of Onset  . Healthy Mother   . Healthy Father     Social History Social History   Tobacco Use  . Smoking status: Current Every Day Smoker    Packs/day: 0.25    Types: Cigarettes  . Smokeless tobacco: Never Used  Substance Use Topics  . Alcohol use: Yes  . Drug use: Yes    Types: Marijuana     Allergies   Patient has no known allergies.   Review of Systems Review of Systems  Constitutional: Negative for fever.  HENT: Negative for sore throat.   Respiratory: Negative for shortness of breath.   Cardiovascular: Negative for chest pain.  Gastrointestinal: Positive for abdominal pain. Negative for nausea and vomiting.  Genitourinary: Positive for dysuria and frequency. Negative for difficulty urinating, discharge, penile pain, penile swelling, scrotal swelling and testicular pain.  Skin: Negative for rash.  Neurological: Negative for dizziness, light-headedness and headaches.     Physical Exam Triage Vital Signs ED Triage Vitals  Enc Vitals Group     BP 01/15/19 1054 124/77     Pulse Rate 01/15/19 1054 83     Resp 01/15/19 1054 18  Temp 01/15/19 1054 98.5 F (36.9 C)     Temp Source 01/15/19 1054 Oral     SpO2 01/15/19 1054 100 %     Weight --      Height --      Head Circumference --      Peak Flow --      Pain Score 01/15/19 1055 4     Pain Loc --      Pain Edu? --      Excl. in GC? --    No data found.  Updated Vital Signs BP 124/77 (BP Location: Left Arm)   Pulse 83   Temp 98.5 F (36.9 C) (Oral)   Resp 18   SpO2 100%   Visual Acuity Right Eye Distance:   Left Eye Distance:   Bilateral Distance:    Right Eye Near:   Left Eye Near:    Bilateral Near:     Physical Exam Vitals signs and nursing note reviewed.  Constitutional:       Appearance: He is well-developed.     Comments: No acute distress  HENT:     Head: Normocephalic and atraumatic.     Nose: Nose normal.  Eyes:     Conjunctiva/sclera: Conjunctivae normal.  Neck:     Musculoskeletal: Neck supple.  Cardiovascular:     Rate and Rhythm: Normal rate.  Pulmonary:     Effort: Pulmonary effort is normal. No respiratory distress.  Abdominal:     General: There is no distension.     Comments: Abdomen soft, nondistended, tenderness to left lower quadrant extending into left groin region.  Negative rebound, negative Rovsing, negative McBurney's  Genitourinary:    Comments: Palpable left inguinal lymphadenopathy that is tender Musculoskeletal: Normal range of motion.  Skin:    General: Skin is warm and dry.  Neurological:     Mental Status: He is alert and oriented to person, place, and time.      UC Treatments / Results  Labs (all labs ordered are listed, but only abnormal results are displayed) Labs Reviewed  POCT URINALYSIS DIP (DEVICE) - Abnormal; Notable for the following components:      Result Value   Glucose, UA 100 (*)    All other components within normal limits  URINE CYTOLOGY ANCILLARY ONLY    EKG   Radiology No results found.  Procedures Procedures (including critical care time)  Medications Ordered in UC Medications  cefTRIAXone (ROCEPHIN) injection 250 mg (250 mg Intramuscular Given 01/15/19 1158)  lidocaine-EPINEPHrine (XYLOCAINE W/EPI) 2 %-1:200000 (PF) injection (has no administration in time range)  cefTRIAXone (ROCEPHIN) 250 MG injection (has no administration in time range)  lidocaine (XYLOCAINE) 2 % (with pres) injection (has no administration in time range)    Initial Impression / Assessment and Plan / UC Course  I have reviewed the triage vital signs and the nursing notes.  Pertinent labs & imaging results that were available during my care of the patient were reviewed by me and considered in my medical decision  making (see chart for details).     Does have 100 glucose in urine, no documented history of diabetes.  Possible cause of frequency, would continue to monitor.  No signs of leuks or nitrates, less likely UTI.  Given patient's age and previously has had positive STDs feel this was most likely caused along with lymphadenopathy.  Will treat for gonorrhea and chlamydia, Rocephin in clinic today, will treat with doxycycline given inguinal lymphadenopathy.  Continue to monitor pain,  follow-up if symptoms not resolving or worsening.Discussed strict return precautions. Patient verbalized understanding and is agreeable with plan.  Final Clinical Impressions(s) / UC Diagnoses   Final diagnoses:  Left lower quadrant abdominal pain  Dysuria  Muscle spasm     Discharge Instructions     We have treated you today for gonorrhea, with rocephin. Begin doxycycline twice daily for 1 week to treat chlamydia/urethritis. Please refrain from sexual activity for 7 days while medicine is clearing infection.  We are testing you for Gonorrhea, Chlamydia and Trichomonas. We will call you if anything is positive and let you know if you require any further treatment. Please inform partner of any positive results.  I refilled flexeril to use after work for muscle spasms  Please return if symptoms not improving with treatment, development of fever, nausea, vomiting, abdominal pain, scrotal pain.   ED Prescriptions    Medication Sig Dispense Auth. Provider   cyclobenzaprine (FLEXERIL) 10 MG tablet Take 1 tablet (10 mg total) by mouth 2 (two) times daily as needed for muscle spasms. 20 tablet Orestes Geiman C, PA-C   doxycycline (VIBRAMYCIN) 100 MG capsule Take 1 capsule (100 mg total) by mouth 2 (two) times daily for 7 days. 14 capsule Coston Mandato C, PA-C     Controlled Substance Prescriptions [place] Controlled Substance Registry consulted? Not Applicable   Janith Lima, PA-C 01/16/19 0933    Janith Lima, PA-C 01/16/19 [phone]

## 2019-01-19 LAB — URINE CYTOLOGY ANCILLARY ONLY
Chlamydia: NEGATIVE
Neisseria Gonorrhea: NEGATIVE
Trichomonas: NEGATIVE

## 2019-01-27 ENCOUNTER — Telehealth (HOSPITAL_COMMUNITY): Payer: Self-pay | Admitting: Emergency Medicine

## 2019-01-27 ENCOUNTER — Other Ambulatory Visit: Payer: Self-pay

## 2019-01-27 ENCOUNTER — Ambulatory Visit (HOSPITAL_COMMUNITY)
Admission: EM | Admit: 2019-01-27 | Discharge: 2019-01-27 | Disposition: A | Discharge status: Home / Self Care | Payer: Medicaid Other | Attending: Emergency Medicine | Admitting: Emergency Medicine

## 2019-01-27 ENCOUNTER — Encounter (HOSPITAL_COMMUNITY): Payer: Self-pay

## 2019-01-27 DIAGNOSIS — R1031 Right lower quadrant pain: Secondary | ICD-10-CM | POA: Diagnosis not present

## 2019-01-27 MED ORDER — IBUPROFEN 600 MG PO TABS: 600.0000 mg | ORAL_TABLET | Freq: Four times a day (QID) | ORAL | 0 refills | Status: DC | PRN

## 2019-01-27 NOTE — ED Triage Notes (Signed)
Pt states he has a sharp stabbing pain on his right side. It starting to radiate down his right leg.

## 2019-01-27 NOTE — ED Provider Notes (Signed)
HPI  SUBJECTIVE:  Benjamin Hughes is a 18 y.o. male who presents with 4 weeks of sharp right lower quadrant pain radiating into his hip/groin that has become constant over the past 2 weeks.  He denies nausea, fevers, abdominal distention, back pain, flank pain.  Reports occasional nonbilious nonbloody emesis, but is tolerating p.o. well.  No flank pain, hip pain.  No dysuria, urgency, frequency, cloudy or odorous urine, hematuria.  No testicular pain or swelling, no scrotal swelling, erythema.  Had a normal bowel movement today.  No trauma to the abdomen or groin.  No anorexia.  Car ride over here was not painful.  He tried Flexeril and an unknown pain pill.  The unknown pain pill helped.  Symptoms are worse with walking around extensively.  He has a past medical history of UTI, gonorrhea, chlamydia, HSV, trichomonas.  No history of abdominal surgeries, diabetes, diverticulitis, constipation, appendicitis, nephrolithiasis, pyelonephritis, hernia, HIV, syphilis.  Family history negative for nephrolithiasis.  [place]  Was seen here 12 days ago for UTI/STD screening.  Was reporting left lower abdominal pain per note, but patient states it was on the right.  He was empirically treated for gonorrhea and chlamydia, which ended up being negative.  Trichomonas also negative.  UA was negative for UTI.   Past Medical History:  Diagnosis Date  . ADHD   . Anxiety   . PTSD (post-traumatic stress disorder)     Past Surgical History:  Procedure Laterality Date  . WRIST SURGERY      Family History  Problem Relation Age of Onset  . Healthy Mother   . Healthy Father     Social History   Tobacco Use  . Smoking status: Current Every Day Smoker    Packs/day: 0.25    Types: Cigarettes  . Smokeless tobacco: Never Used  Substance Use Topics  . Alcohol use: Yes  . Drug use: Yes    Types: Marijuana    No current facility-administered medications for this encounter.   Current Outpatient  Medications:  .  amphetamine-dextroamphetamine (ADDERALL) 30 MG tablet, Take 30 mg by mouth daily., Disp: , Rfl:  .  cyclobenzaprine (FLEXERIL) 10 MG tablet, Take 1 tablet (10 mg total) by mouth 2 (two) times daily as needed for muscle spasms., Disp: 20 tablet, Rfl: 0 .  ibuprofen (ADVIL) 600 MG tablet, Take 1 tablet (600 mg total) by mouth every 6 (six) hours as needed., Disp: 30 tablet, Rfl: 0 .  sertraline (ZOLOFT) 25 MG tablet, Take 25 mg by mouth daily., Disp: , Rfl:  .  valACYclovir (VALTREX) 1000 MG tablet, Take 1 tablet (1,000 mg total) by mouth daily., Disp: 5 tablet, Rfl: 1  No Known Allergies   ROS  As noted in HPI.   Physical Exam  BP 113/70 (BP Location: Right Arm)   Pulse 77   Temp 98.1 F (36.7 C) (Oral)   Resp 15   Wt 54.4 kg   SpO2 98%   BMI 20.60 kg/m   Constitutional: Well developed, well nourished, no acute distress Eyes:  EOMI, conjunctiva normal bilaterally HENT: Normocephalic, atraumatic,mucus membranes moist Respiratory: Normal inspiratory effort Cardiovascular: Normal rate GI: nondistended.  Flat, soft. Normal appearance.  Active bowel sounds.  No guarding, rebound. Mild right lower quadrant tenderness. GU: Positive tenderness along the right inguinal canal.  Normal penis.  Testes descended bilaterally, no epididymal, testicular tenderness.  No scrotal erythema, swelling.  No appreciable inguinal hernia visualized or palpated.  Patient declined chaperone. skin: No  rash, skin intact Musculoskeletal: No tenderness about the hip.  No tenderness with AROM or PROM of the hip. Neurologic: Alert & oriented x 3, no focal neuro deficits Psychiatric: Speech and behavior appropriate   ED Course   Medications - No data to display  No orders of the defined types were placed in this encounter.   No results found for this or any previous visit (from the past 24 hour(s)). No results found.  ED Clinical Impression  1. Right inguinal pain      ED  Assessment/Plan  Urgent care records reviewed.  As noted in HPI.  Suspect small inguinal hernia.  Does not appear to be from the psoas muscle, does not appear to be located in the hip.  No evidence of appendicitis.  Doubt nephrolithiasis.  Sending home with Tylenol/ibuprofen, advised stool softeners, avoid straining, will refer to [facility] surgery for further evaluation.  To the ER if he gets worse.  Discussed  MDM, treatment plan, and plan for follow-up with patient. Discussed sn/sx that should prompt return to the ED. patient agrees with plan.   Meds ordered this encounter  Medications  . ibuprofen (ADVIL) 600 MG tablet    Sig: Take 1 tablet (600 mg total) by mouth every 6 (six) hours as needed.    Dispense:  30 tablet    Refill:  0    *This clinic note was created using [employer]. Therefore, there may be occasional mistakes despite careful proofreading.   ?   Melynda Ripple, MD 01/27/19 1736

## 2019-01-27 NOTE — Discharge Instructions (Addendum)
I suspect that you may have a small inguinal hernia.  Follow-up with [facility] surgery if you are not getting better with the Tylenol and ibuprofen combination in a week.  Take 600 mg ibuprofen combined with 1 g of Tylenol 3-4 times a day as needed for pain.  Make sure that your stools are soft.  Make sure you drink plenty of extra water, may use MiraLAX to make sure that your stools are soft.  Straining can make this worse.

## 2019-01-27 NOTE — Telephone Encounter (Signed)
Pt called asking about test results. Pt given results, pt also states he is still having symptoms of sharp lower abdominal pain, pt encouraged to return for follow up. Pt agreeable to plan.

## 2019-02-17 ENCOUNTER — Ambulatory Visit (HOSPITAL_COMMUNITY)
Admission: EM | Admit: 2019-02-17 | Discharge: 2019-02-17 | Disposition: A | Discharge status: Home / Self Care | Payer: Medicaid Other | Attending: Family Medicine | Admitting: Family Medicine

## 2019-02-17 ENCOUNTER — Encounter (HOSPITAL_COMMUNITY): Payer: Self-pay | Admitting: Emergency Medicine

## 2019-02-17 DIAGNOSIS — K1379 Other lesions of oral mucosa: Secondary | ICD-10-CM | POA: Diagnosis present

## 2019-02-17 MED ORDER — MAGIC MOUTHWASH W/LIDOCAINE: 5.0000 mL | Freq: Three times a day (TID) | ORAL | 0 refills | Status: DC | PRN

## 2019-02-17 NOTE — ED Triage Notes (Signed)
Pt c/o mouth pain, states his tongue is painful, gradually getting worse over the last two weeks. No obvious sores or swelling noted.

## 2019-02-17 NOTE — Discharge Instructions (Signed)
Use the Magic mouthwash as prescribed.  We are testing you for HIV and syphilis. Not convinced this is a herpes flare. Follow up as needed for continued or worsening symptoms

## 2019-02-17 NOTE — ED Provider Notes (Signed)
[place]    CSN: [id number] Arrival date & time: 02/17/19  1316     History   Chief Complaint Chief Complaint  Patient presents with  . Appointment  . Oral Pain    HPI Benjamin Hughes is a 18 y.o. male.   Patient is a 18 year old male who presents today with painful tongue gradually getting worse over the last 2 weeks.  He has had trouble eating and drinking due to the pain. Describes as sharp and burning at times. Reports he has not seen any sores in the mouth. Hx of HSV 2. Recently tested for STDs and has not been sexually active since. He has not been tested for HIV or syphilis recently. No injuries to the mouth. No recent viral illness. No fever.   ROS per HPI      Past Medical History:  Diagnosis Date  . ADHD   . Anxiety   . PTSD (post-traumatic stress disorder)     There are no active problems to display for this patient.   Past Surgical History:  Procedure Laterality Date  . WRIST SURGERY         Home Medications    Prior to Admission medications   Medication Sig Start Date End Date Taking? Authorizing Provider  amphetamine-dextroamphetamine (ADDERALL) 30 MG tablet Take 30 mg by mouth daily.    [provider]  cyclobenzaprine (FLEXERIL) 10 MG tablet Take 1 tablet (10 mg total) by mouth 2 (two) times daily as needed for muscle spasms. 01/15/19   Wieters, Hallie C, PA-C  ibuprofen (ADVIL) 600 MG tablet Take 1 tablet (600 mg total) by mouth every 6 (six) hours as needed. 01/27/19   Melynda Ripple, MD  magic mouthwash w/lidocaine SOLN Take 5 mLs by mouth 3 (three) times daily as needed for mouth pain. 02/17/19   Loura Halt A, NP  sertraline (ZOLOFT) 25 MG tablet Take 25 mg by mouth daily.    [provider]  valACYclovir (VALTREX) 1000 MG tablet Take 1 tablet (1,000 mg total) by mouth daily. 10/06/18   Raylene Everts, MD    Family History Family History  Problem Relation Age of Onset  . Healthy Mother   . Healthy  Father     Social History Social History   Tobacco Use  . Smoking status: Current Every Day Smoker    Packs/day: 0.25    Types: Cigarettes  . Smokeless tobacco: Never Used  Substance Use Topics  . Alcohol use: Yes  . Drug use: Yes    Types: Marijuana     Allergies   Patient has no known allergies.   Review of Systems Review of Systems   Physical Exam Triage Vital Signs ED Triage Vitals  Enc Vitals Group     BP 02/17/19 1331 116/76     Pulse Rate 02/17/19 1331 79     Resp 02/17/19 1331 18     Temp 02/17/19 1331 98.3 F (36.8 C)     Temp src --      SpO2 02/17/19 1331 100 %     Weight --      Height --      Head Circumference --      Peak Flow --      Pain Score 02/17/19 1332 9     Pain Loc --      Pain Edu? --      Excl. in [place]? --    No data found.  Updated Vital Signs BP 116/76  Pulse 79   Temp 98.3 F (36.8 C)   Resp 18   SpO2 100%   Visual Acuity Right Eye Distance:   Left Eye Distance:   Bilateral Distance:    Right Eye Near:   Left Eye Near:    Bilateral Near:     Physical Exam Vitals signs and nursing note reviewed.  Constitutional:      General: He is not in acute distress.    Appearance: Normal appearance. He is not ill-appearing, toxic-appearing or diaphoretic.  HENT:     Head: Normocephalic and atraumatic.     Nose: Nose normal.     Mouth/Throat:     Mouth: Mucous membranes are moist.     Pharynx: Oropharynx is clear.     Comments: White/yellow patch noted to the top of tongue, centrally.  Some redness to tongue, tender to touch.  Small ulcer underneath tongue.  Neurological:     Mental Status: He is alert.      UC Treatments / Results  Labs (all labs ordered are listed, but only abnormal results are displayed) Labs Reviewed  HIV ANTIBODY (ROUTINE TESTING W REFLEX)  RPR    EKG   Radiology No results found.  Procedures Procedures (including critical care time)  Medications Ordered in UC Medications - No  data to display  Initial Impression / Assessment and Plan / UC Course  I have reviewed the triage vital signs and the nursing notes.  Pertinent labs & imaging results that were available during my care of the patient were reviewed by me and considered in my medical decision making (see chart for details).     Oral pain-treating for thrush based on symptoms and presentation. Repeating HIV and syphilis.  No concern for HSV 1 Labs pending.  Follow up as needed for continued or worsening symptoms  Final Clinical Impressions(s) / UC Diagnoses   Final diagnoses:  Oral pain     Discharge Instructions     Use the Magic mouthwash as prescribed.  We are testing you for HIV and syphilis. Not convinced this is a herpes flare. Follow up as needed for continued or worsening symptoms     ED Prescriptions    Medication Sig Dispense Auth. Provider   magic mouthwash w/lidocaine SOLN Take 5 mLs by mouth 3 (three) times daily as needed for mouth pain. 100 mL Dahlia ByesBast, Beatriz Settles A, NP     Controlled Substance Prescriptions [place] Controlled Substance Registry consulted? Not Applicable   Janace ArisBast, Yacoub Diltz A, NP 02/17/19 1523

## 2019-02-18 LAB — RPR: RPR Ser Ql: NONREACTIVE

## 2019-02-18 LAB — HIV ANTIBODY (ROUTINE TESTING W REFLEX): HIV Screen 4th Generation wRfx: NONREACTIVE

## 2019-04-02 ENCOUNTER — Emergency Department (HOSPITAL_COMMUNITY)
Admission: EM | Admit: 2019-04-02 | Discharge: 2019-04-02 | Discharge status: Against Medical Advice | Payer: Medicaid Other | Attending: Emergency Medicine | Admitting: Emergency Medicine

## 2019-04-02 ENCOUNTER — Other Ambulatory Visit: Payer: Self-pay

## 2019-04-02 ENCOUNTER — Ambulatory Visit (HOSPITAL_COMMUNITY)
Admission: EM | Admit: 2019-04-02 | Discharge: 2019-04-02 | Disposition: A | Discharge status: Home / Self Care | Payer: Medicaid Other | Attending: Family Medicine | Admitting: Family Medicine

## 2019-04-02 ENCOUNTER — Ambulatory Visit (INDEPENDENT_AMBULATORY_CARE_PROVIDER_SITE_OTHER): Payer: Medicaid Other

## 2019-04-02 ENCOUNTER — Encounter (HOSPITAL_COMMUNITY): Payer: Self-pay | Admitting: Emergency Medicine

## 2019-04-02 DIAGNOSIS — S6991XA Unspecified injury of right wrist, hand and finger(s), initial encounter: Secondary | ICD-10-CM

## 2019-04-02 DIAGNOSIS — Z5321 Procedure and treatment not carried out due to patient leaving prior to being seen by health care provider: Secondary | ICD-10-CM | POA: Diagnosis not present

## 2019-04-02 DIAGNOSIS — Y929 Unspecified place or not applicable: Secondary | ICD-10-CM | POA: Diagnosis not present

## 2019-04-02 DIAGNOSIS — Y939 Activity, unspecified: Secondary | ICD-10-CM | POA: Insufficient documentation

## 2019-04-02 DIAGNOSIS — S61411A Laceration without foreign body of right hand, initial encounter: Secondary | ICD-10-CM | POA: Diagnosis not present

## 2019-04-02 DIAGNOSIS — Z23 Encounter for immunization: Secondary | ICD-10-CM | POA: Diagnosis not present

## 2019-04-02 DIAGNOSIS — W25XXXA Contact with sharp glass, initial encounter: Secondary | ICD-10-CM | POA: Diagnosis not present

## 2019-04-02 DIAGNOSIS — Y999 Unspecified external cause status: Secondary | ICD-10-CM | POA: Insufficient documentation

## 2019-04-02 MED ORDER — TETANUS-DIPHTH-ACELL PERTUSSIS 5-2.5-18.5 LF-MCG/0.5 IM SUSP
0.5000 mL | Freq: Once | INTRAMUSCULAR | Status: AC
  Administered 2019-04-02: 0.5 mL via INTRAMUSCULAR

## 2019-04-02 MED ORDER — TETANUS-DIPHTH-ACELL PERTUSSIS 5-2.5-18.5 LF-MCG/0.5 IM SUSP
INTRAMUSCULAR | Status: AC
  Filled 2019-04-02: qty 0.5

## 2019-04-02 MED ORDER — ONDANSETRON 4 MG PO TBDP
ORAL_TABLET | ORAL | Status: AC
  Filled 2019-04-02: qty 1

## 2019-04-02 MED ORDER — TRAMADOL HCL 50 MG PO TABS: 50.0000 mg | ORAL_TABLET | Freq: Two times a day (BID) | ORAL | 0 refills | Status: AC | PRN

## 2019-04-02 MED ORDER — ONDANSETRON 4 MG PO TBDP
4.0000 mg | ORAL_TABLET | Freq: Once | ORAL | Status: AC
  Administered 2019-04-02: 4 mg via ORAL

## 2019-04-02 NOTE — ED Notes (Signed)
Pt yelling from triage rm 1, security called to bedside.

## 2019-04-02 NOTE — ED Notes (Signed)
Patient reports that he is leaving because he needs to go find his car.
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
## 2019-04-02 NOTE — Discharge Instructions (Signed)
2 sutures placed in the wound Come back in 1 week for removal.  Tetanus updated here Keep area clean and covered when working.  Follow up as needed for continued or worsening symptoms
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
## 2019-08-08 ENCOUNTER — Ambulatory Visit (HOSPITAL_COMMUNITY)
Admission: EM | Admit: 2019-08-08 | Discharge: 2019-08-08 | Disposition: A | Discharge status: Home / Self Care | Payer: Medicaid Other | Attending: Internal Medicine | Admitting: Internal Medicine

## 2019-08-08 ENCOUNTER — Other Ambulatory Visit: Payer: Self-pay

## 2019-08-08 ENCOUNTER — Encounter (HOSPITAL_COMMUNITY): Payer: Self-pay | Admitting: *Deleted

## 2019-08-08 DIAGNOSIS — S43401A Unspecified sprain of right shoulder joint, initial encounter: Secondary | ICD-10-CM | POA: Diagnosis not present

## 2019-08-08 MED ORDER — ACETAMINOPHEN 500 MG PO TABS: 500.0000 mg | ORAL_TABLET | Freq: Four times a day (QID) | ORAL | 0 refills | Status: AC | PRN

## 2019-08-08 MED ORDER — CYCLOBENZAPRINE HCL 10 MG PO TABS: 10.0000 mg | ORAL_TABLET | Freq: Two times a day (BID) | ORAL | 0 refills | Status: AC | PRN

## 2019-08-08 NOTE — ED Triage Notes (Signed)
Pt c/o chronic pain to right shoulder over past yr due to heavy lifting for job.  States gets seen at [facility] intermittently for cyclobenzaprine Rxs.  RUE CMS intact.

## 2019-08-08 NOTE — ED Provider Notes (Signed)
[place]    CSN: [id number] Arrival date & time: 08/08/19  1024      History   Chief Complaint Chief Complaint  Patient presents with  . Shoulder Pain    HPI Benjamin Hughes is a 19 y.o. male with a history of ADHD comes to urgent care with complaints of right shoulder pain over the past several days.  Patient started work in a warehouse recently.  He has been lifting heavy objects daily.  Pain is sharp and throbbing.  Is worse after work.  Pain initially started a year ago.  No known relieving factors.  Patient has tried ibuprofen and Motrin.  He denies any numbness, tingling or weakness in the arm.  This problem has been recurrent over the past year.  Patient previously tried Flexeril with some good results.  HPI  Past Medical History:  Diagnosis Date  . ADHD   . Anxiety   . PTSD (post-traumatic stress disorder)     There are no problems to display for this patient.   Past Surgical History:  Procedure Laterality Date  . HAND SURGERY         Home Medications    Prior to Admission medications   Medication Sig Start Date End Date Taking? Authorizing Provider  amphetamine-dextroamphetamine (ADDERALL) 30 MG tablet Take 30 mg by mouth daily. Taking 5 days/wk   Yes [provider]  acetaminophen (TYLENOL) 500 MG tablet Take 1 tablet (500 mg total) by mouth every 6 (six) hours as needed. 08/08/19   Alitzel Cookson, Myrene Galas, MD  cyclobenzaprine (FLEXERIL) 10 MG tablet Take 1 tablet (10 mg total) by mouth 2 (two) times daily as needed for muscle spasms. 08/08/19   Chase Picket, MD  sertraline (ZOLOFT) 25 MG tablet Take 25 mg by mouth daily.  08/08/19  [provider]    Family History Family History  Problem Relation Age of Onset  . Healthy Mother   . Healthy Father     Social History Social History   Tobacco Use  . Smoking status: Current Every Day Smoker    Packs/day: 0.25    Types: Cigarettes  . Smokeless tobacco: Never Used    Substance Use Topics  . Alcohol use: Not Currently  . Drug use: Yes    Types: Marijuana     Allergies   Patient has no known allergies.   Review of Systems Review of Systems  Constitutional: Positive for activity change. Negative for chills, fatigue and fever.  HENT: Negative.   Gastrointestinal: Negative for diarrhea, nausea and vomiting.  Musculoskeletal: Positive for arthralgias and myalgias. Negative for back pain, gait problem, joint swelling and neck pain.  Skin: Negative for rash and wound.  Neurological: Negative for dizziness, weakness and numbness.  Psychiatric/Behavioral: Negative for confusion and decreased concentration.     Physical Exam Triage Vital Signs ED Triage Vitals  Enc Vitals Group     BP 08/08/19 1128 109/68     Pulse Rate 08/08/19 1128 80     Resp 08/08/19 1128 16     Temp 08/08/19 1128 98.2 F (36.8 C)     Temp Source 08/08/19 1128 Oral     SpO2 08/08/19 1128 98 %     Weight --      Height --      Head Circumference --      Peak Flow --      Pain Score 08/08/19 1127 6     Pain Loc --  Pain Edu? --      Excl. in [place]? --    No data found.  Updated Vital Signs BP 109/68   Pulse 80   Temp 98.2 F (36.8 C) (Oral)   Resp 16   SpO2 98%   Visual Acuity Right Eye Distance:   Left Eye Distance:   Bilateral Distance:    Right Eye Near:   Left Eye Near:    Bilateral Near:     Physical Exam Vitals and nursing note reviewed.  Constitutional:      General: He is not in acute distress.    Appearance: He is not ill-appearing.  Cardiovascular:     Rate and Rhythm: Normal rate and regular rhythm.     Pulses: Normal pulses.     Heart sounds: No murmur.  Pulmonary:     Effort: Pulmonary effort is normal.     Breath sounds: Normal breath sounds. No wheezing or rhonchi.  Abdominal:     General: Bowel sounds are normal. There is no distension.     Palpations: Abdomen is soft.     Hernia: No hernia is present.  Musculoskeletal:         General: No swelling or signs of injury. Normal range of motion.     Cervical back: Normal range of motion and neck supple. No rigidity or tenderness.     Right lower leg: No edema.  Skin:    General: Skin is warm.     Capillary Refill: Capillary refill takes less than 2 seconds.     Findings: No bruising, erythema or lesion.  Neurological:     General: No focal deficit present.     Mental Status: He is alert and oriented to person, place, and time.     Cranial Nerves: No cranial nerve deficit.     Sensory: No sensory deficit.     Coordination: Coordination normal.      UC Treatments / Results  Labs (all labs ordered are listed, but only abnormal results are displayed) Labs Reviewed - No data to display  EKG   Radiology No results found.  Procedures Procedures (including critical care time)  Medications Ordered in UC Medications - No data to display  Initial Impression / Assessment and Plan / UC Course  I have reviewed the triage vital signs and the nursing notes.  Pertinent labs & imaging results that were available during my care of the patient were reviewed by me and considered in my medical decision making (see chart for details).     1.  Right shoulder sprain: Flexeril as needed for muscle spasm Continue acetaminophen/Motrin as needed for pain Gentle range of motion exercises Heating therapy If patient symptoms worsens or he develops any weakness, numbness in the right upper extremity he is welcome to return to the urgent care to be reevaluated. Final Clinical Impressions(s) / UC Diagnoses   Final diagnoses:  Sprain of right shoulder, unspecified shoulder sprain type, initial encounter   Discharge Instructions   None    ED Prescriptions    Medication Sig Dispense Auth. Provider   cyclobenzaprine (FLEXERIL) 10 MG tablet Take 1 tablet (10 mg total) by mouth 2 (two) times daily as needed for muscle spasms. 20 tablet Carleigh Buccieri, Britta Mccreedy, MD   acetaminophen  (TYLENOL) 500 MG tablet Take 1 tablet (500 mg total) by mouth every 6 (six) hours as needed. 30 tablet Ryelan Kazee, Britta Mccreedy, MD     PDMP not reviewed this encounter.   Merrilee Jansky,  MD 08/08/19 1235

## 2019-09-17 ENCOUNTER — Ambulatory Visit (INDEPENDENT_AMBULATORY_CARE_PROVIDER_SITE_OTHER): Payer: Medicaid Other

## 2019-09-17 ENCOUNTER — Encounter (HOSPITAL_COMMUNITY): Payer: Self-pay

## 2019-09-17 ENCOUNTER — Ambulatory Visit (HOSPITAL_COMMUNITY)
Admission: EM | Admit: 2019-09-17 | Discharge: 2019-09-17 | Disposition: A | Discharge status: Home / Self Care | Payer: Medicaid Other | Attending: Family Medicine | Admitting: Family Medicine

## 2019-09-17 ENCOUNTER — Other Ambulatory Visit: Payer: Self-pay

## 2019-09-17 DIAGNOSIS — S62144A Nondisplaced fracture of body of hamate [unciform] bone, right wrist, initial encounter for closed fracture: Secondary | ICD-10-CM

## 2019-09-17 DIAGNOSIS — S62346A Nondisplaced fracture of base of fifth metacarpal bone, right hand, initial encounter for closed fracture: Secondary | ICD-10-CM

## 2019-09-17 DIAGNOSIS — S62364A Nondisplaced fracture of neck of fourth metacarpal bone, right hand, initial encounter for closed fracture: Secondary | ICD-10-CM | POA: Diagnosis not present

## 2019-09-17 MED ORDER — HYDROCODONE-ACETAMINOPHEN 5-325 MG PO TABS: 1.0000 | ORAL_TABLET | Freq: Four times a day (QID) | ORAL | 0 refills | Status: AC | PRN

## 2019-09-17 NOTE — ED Provider Notes (Addendum)
[place]   [id number] 09/17/19 Arrival Time: 4174  ASSESSMENT & PLAN:  1. Closed nondisplaced fracture of base of fifth metacarpal bone of right hand, initial encounter   2. Closed nondisplaced fracture of hamate bone of right wrist, unspecified portion of hamate, initial encounter     I have personally viewed the imaging studies ordered this visit. Apparent fracture of base of fifth metacarpal.  OTC ibuprofen. Work note provided. Orthopaedic tech to apply ulnar gutter splint.  Meds ordered this encounter  Medications  . HYDROcodone-acetaminophen (NORCO/VICODIN) 5-325 MG tablet    Sig: Take 1 tablet by mouth every 6 (six) hours as needed for moderate pain or severe pain.    Dispense:  8 tablet    Refill:  0    Orders Placed This Encounter  Procedures  . DG Hand Complete Right  . Apply splint short arm  . Apply sling arm foam    Recommend: Follow-up Information    Schedule an appointment as soon as possible for a visit  with Leanora Cover, MD.   Specialty: Orthopedic Surgery Contact information: [address] [address] [phone]             Discharge Instructions     Please rest, ice and elevate the affected extremity. Do not remove your splint. You may use a garbage bag while showering to keep your splint dry. Please return here if you are experiencing increased pain, tingling/numbness, swelling, redness, or fever.  Be aware, you have been prescribed pain medications that may cause drowsiness. While taking this medication, do not take any other medications containing acetaminophen (Tylenol). Do not combine with alcohol or other illicit drugs. Please do not drive, operate heavy machinery, or take part in activities that require making important decisions while on this medication as your judgement may be clouded.      [place] Controlled Substances Registry consulted for this patient. I feel the risk/benefit ratio today is favorable for  proceeding with this prescription for a controlled substance. Medication sedation precautions given.  Reviewed expectations re: course of current medical issues. Questions answered. Outlined signs and symptoms indicating need for more acute intervention. Patient verbalized understanding. After Visit Summary given.  SUBJECTIVE: History from: patient. Benjamin Hughes is a 19 y.o. male who reports persistent pain of his right hand; described as aching; without radiation. Onset: abrupt. First noted: today. Injury/trama: reports punching wall with closed fist this morning. Swelling noted. Symptoms have progressed to a point and plateaued since beginning. Aggravating factors: certain movements and grasping. Alleviating factors: have not been identified. Associated symptoms: none reported. Extremity sensation changes or weakness: none. Self treatment: has not tried OTC therapies.    Past Surgical History:  Procedure Laterality Date  . HAND SURGERY        OBJECTIVE:  Vitals:   09/17/19 1509 09/17/19 1513  BP:  113/78  Pulse:  77  Resp:  18  Temp:  99 F (37.2 C)  TempSrc:  Oral  SpO2:  97%  Weight: 59.4 kg     General appearance: alert; no distress HEENT: [place]; AT Neck: supple with FROM Resp: unlabored respirations Extremities: . RUE: warm with well perfused appearance; poorly localized marked tenderness over right hand, specifically over proximal fifth metacarpal; without gross deformities; swelling: moderate; bruising: none; wrist ROM: normal, with discomfort CV: brisk extremity capillary refill of RUE; 2+ radial pulse of RUE. Skin: warm and dry; no visible rashes Neurologic: gait normal; normal sensation and strength of RUE Psychological:  alert and cooperative; normal mood and affect  Imaging: DG Hand Complete Right  Result Date: 09/17/2019 CLINICAL DATA:  19 year old male punched a wall. EXAM: RIGHT HAND - COMPLETE 3+ VIEW COMPARISON:  Right hand radiograph dated  04/02/2019. FINDINGS: There is a mildly displaced fracture of the base of the fifth metacarpal. A mildly displaced fracture along the dorsum of the wrist on the lateral view may represent a fracture of the hamate. There is no dislocation. The bones are well mineralized. The soft tissue swelling of the dorsum of the hand. No radiopaque foreign object or soft tissue gas. IMPRESSION: Mildly displaced fracture of the base of the fifth metacarpal and probable fracture of the hamate. Electronically Signed   By: Elgie Collard M.D.   On: 09/17/2019 15:45    No Known Allergies  Past Medical History:  Diagnosis Date  . ADHD   . Anxiety   . PTSD (post-traumatic stress disorder)    Social History   Socioeconomic History  . Marital status: Single    Spouse name: Not on file  . Number of children: Not on file  . Years of education: Not on file  . Highest education level: Not on file  Occupational History  . Not on file  Tobacco Use  . Smoking status: Current Every Day Smoker    Packs/day: 0.25    Types: Cigarettes  . Smokeless tobacco: Never Used  Substance and Sexual Activity  . Alcohol use: Not Currently  . Drug use: Yes    Types: Marijuana  . Sexual activity: Not on file  Other Topics Concern  . Not on file  Social History Narrative  . Not on file   Social Determinants of Health   Financial Resource Strain:   . Difficulty of Paying Living Expenses: Not on file  Food Insecurity:   . Worried About Programme researcher, broadcasting/film/video in the Last Year: Not on file  . Ran Out of Food in the Last Year: Not on file  Transportation Needs:   . Lack of Transportation (Medical): Not on file  . Lack of Transportation (Non-Medical): Not on file  Physical Activity:   . Days of Exercise per Week: Not on file  . Minutes of Exercise per Session: Not on file  Stress:   . Feeling of Stress : Not on file  Social Connections:   . Frequency of Communication with Friends and Family: Not on file  . Frequency of  Social Gatherings with Friends and Family: Not on file  . Attends Religious Services: Not on file  . Active Member of Clubs or Organizations: Not on file  . Attends Banker Meetings: Not on file  . Marital Status: Not on file   Family History  Problem Relation Age of Onset  . Healthy Mother   . Healthy Father    Past Surgical History:  Procedure Laterality Date  . HAND SURGERY        Mardella Layman, MD 09/17/19 3154    Mardella Layman, MD 09/17/19 [phone]

## 2019-09-17 NOTE — ED Triage Notes (Signed)
Pt states he punched the wall this morning. ( right hand )

## 2019-09-17 NOTE — Discharge Instructions (Addendum)
Please rest, ice and elevate the affected extremity. Do not remove your splint. You may use a garbage bag while showering to keep your splint dry. Please return here if you are experiencing increased pain, tingling/numbness, swelling, redness, or fever.  Be aware, you have been prescribed pain medications that may cause drowsiness. While taking this medication, do not take any other medications containing acetaminophen (Tylenol). Do not combine with alcohol or other illicit drugs. Please do not drive, operate heavy machinery, or take part in activities that require making important decisions while on this medication as your judgement may be clouded.

## 2019-09-17 NOTE — Progress Notes (Signed)
Orthopedic Tech Progress Note Patient Details:  Benjamin Hughes April 24, 2001 [id number]  Ortho Devices Type of Ortho Device: Arm sling, Ulna gutter splint Ortho Device/Splint Location: RUE Ortho Device/Splint Interventions: Ordered, Application   Post Interventions Patient Tolerated: Well Instructions Provided: Care of device, Adjustment of device   Donald Pore 09/17/2019, 4:36 PM
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
